# Patient Record
Sex: Male | Born: 1955 | Race: Black or African American | Hispanic: No | Marital: Married | Smoking: Never smoker
Health system: Southern US, Community
[De-identification: ages and names within clinical notes are randomized; demographics above are authoritative.]

## PROBLEM LIST (undated history)

## (undated) DIAGNOSIS — H539 Unspecified visual disturbance: Secondary | ICD-10-CM

## (undated) HISTORY — PX: CYST REMOVAL HAND: SHX6279

## (undated) HISTORY — DX: Unspecified visual disturbance: H53.9

---

## 1999-04-15 ENCOUNTER — Emergency Department (HOSPITAL_COMMUNITY): Admission: EM | Admit: 1999-04-15 | Discharge: 1999-04-15 | Payer: Self-pay | Admitting: Emergency Medicine

## 2005-02-20 ENCOUNTER — Emergency Department (HOSPITAL_COMMUNITY): Admission: EM | Admit: 2005-02-20 | Discharge: 2005-02-20 | Payer: Self-pay | Admitting: Emergency Medicine

## 2007-10-19 ENCOUNTER — Encounter: Admission: RE | Admit: 2007-10-19 | Discharge: 2007-10-19 | Payer: Self-pay | Admitting: Family Medicine

## 2008-12-10 IMAGING — US US ABDOMEN COMPLETE
1 series · 14 of 25 positions shown · non-contrast
Comparison: none

CLINICAL DATA: Abdominal and back pain. 
 ABDOMEN ULTRASOUND:
TECHNIQUE: Complete abdominal ultrasound examination was performed including evaluation of the liver, gallbladder, bile ducts, pancreas, kidneys, spleen, IVC, and abdominal aorta.  
 No comparison.

[Series 1: us abdomen complete · 0.35mm/px · 14 of 64 slices shown]
[im 1/64]
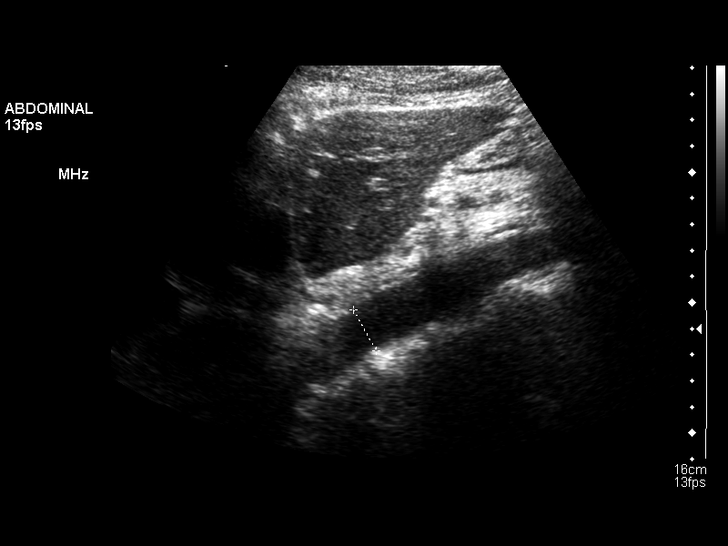
[im 6/64]
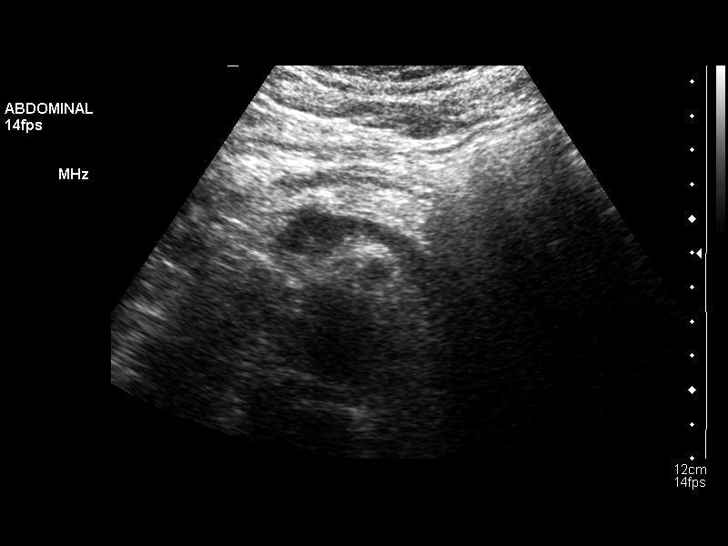
[im 11/64]
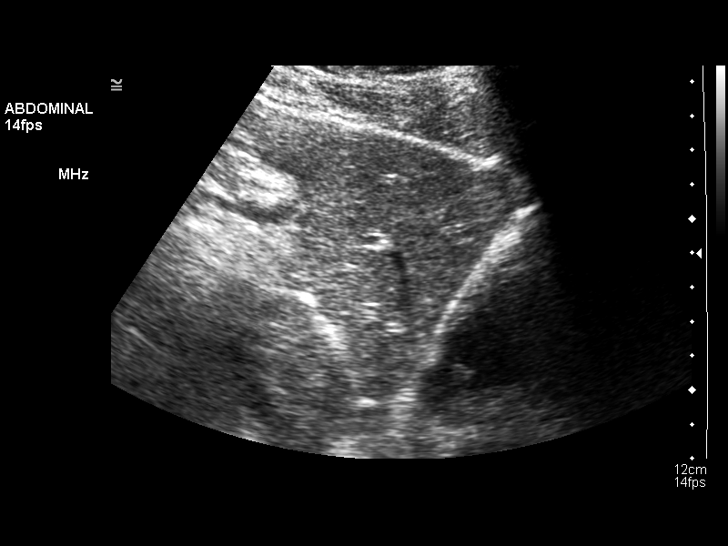
[im 16/64]
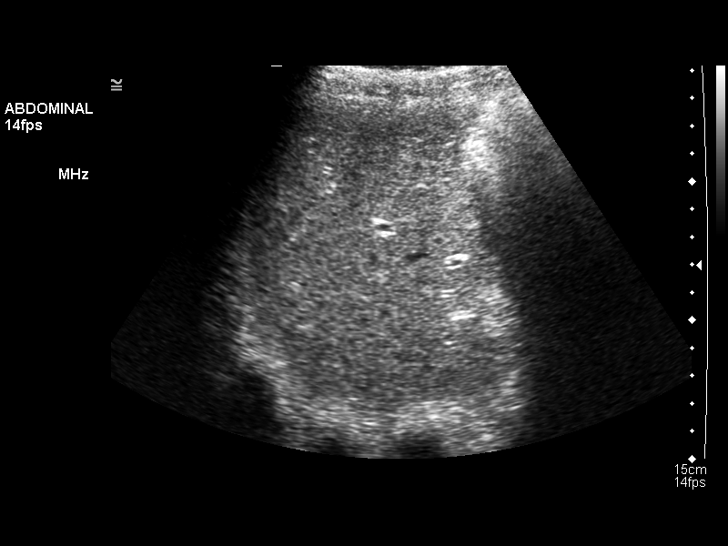
[im 22/64]
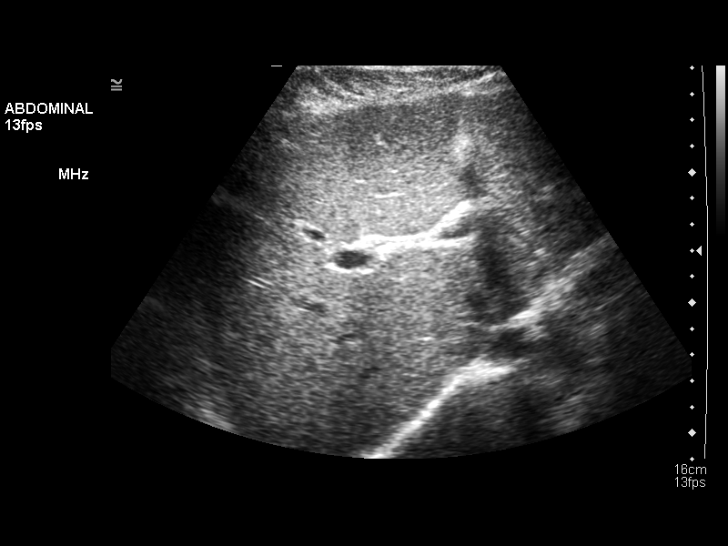
[im 24/64]
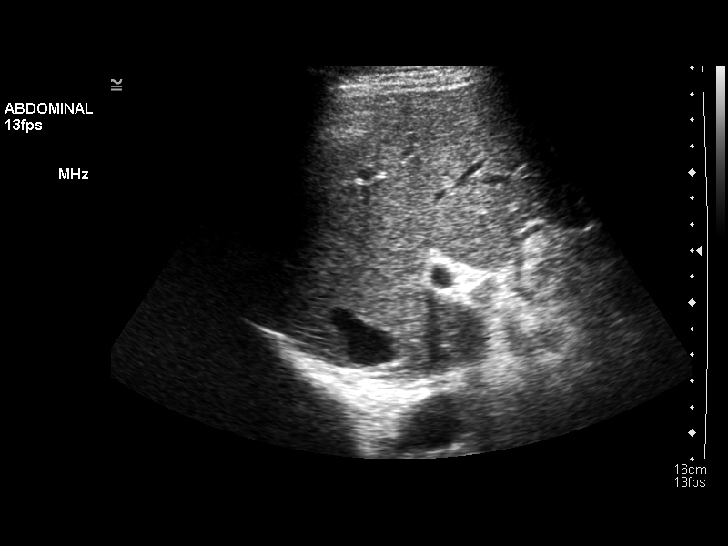
[im 29/64]
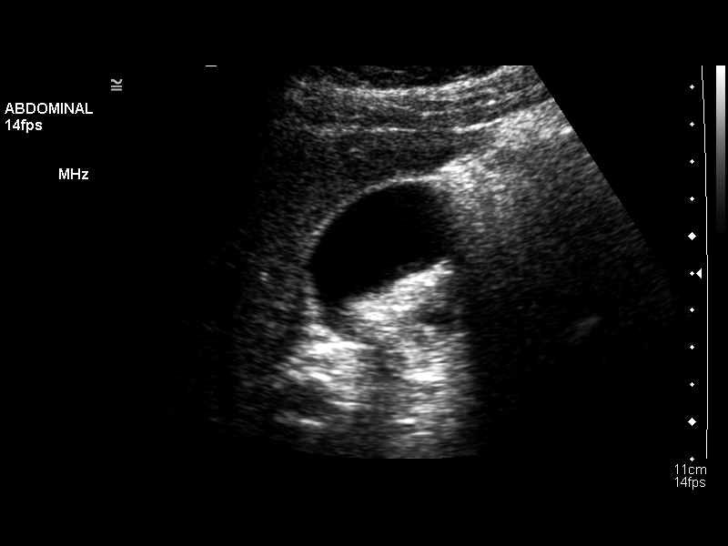
[im 35/64]
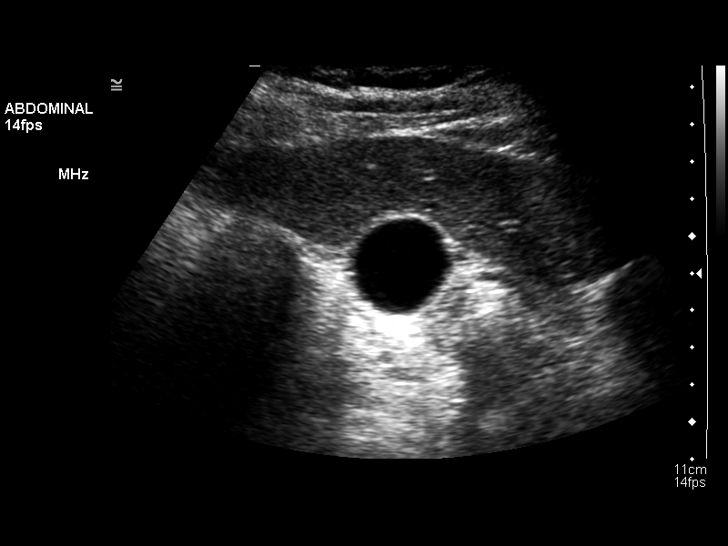
[im 40/64]
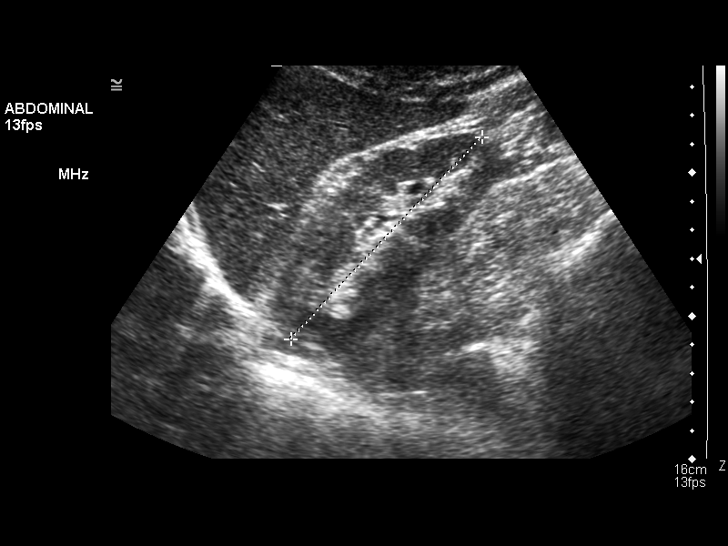
[im 43/64]
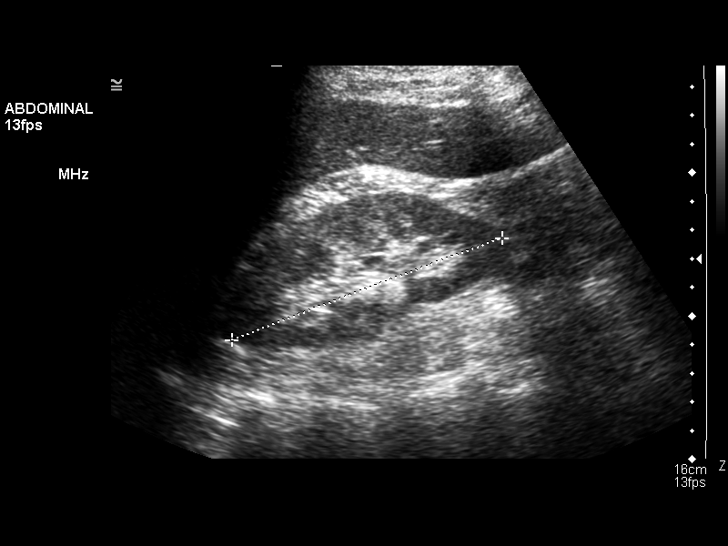
[im 48/64]
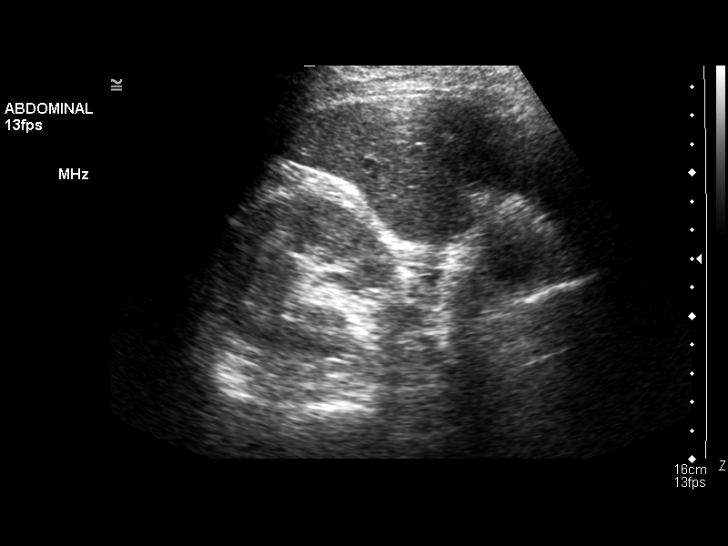
[im 53/64]
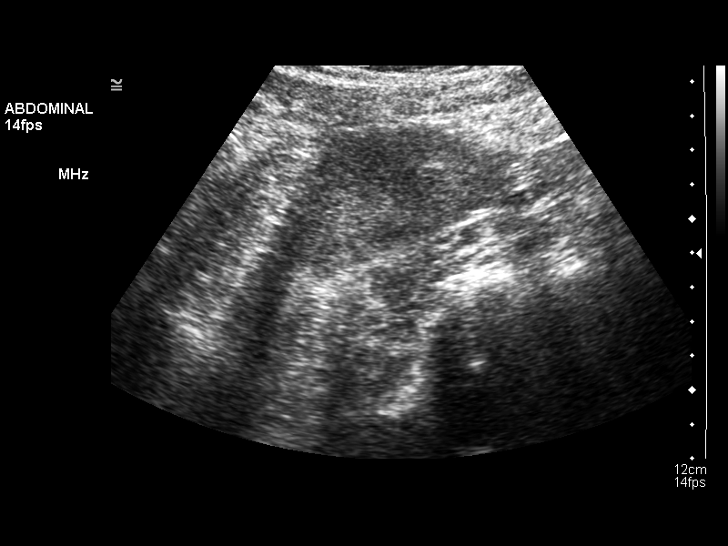
[im 58/64]
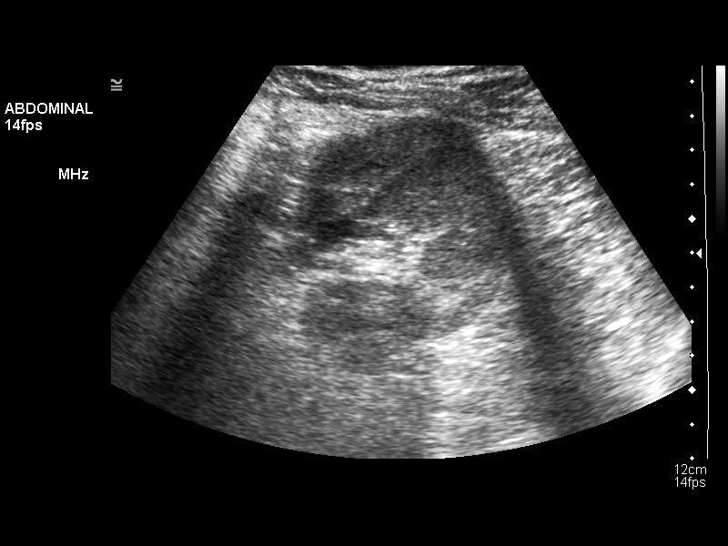
[im 64/64]
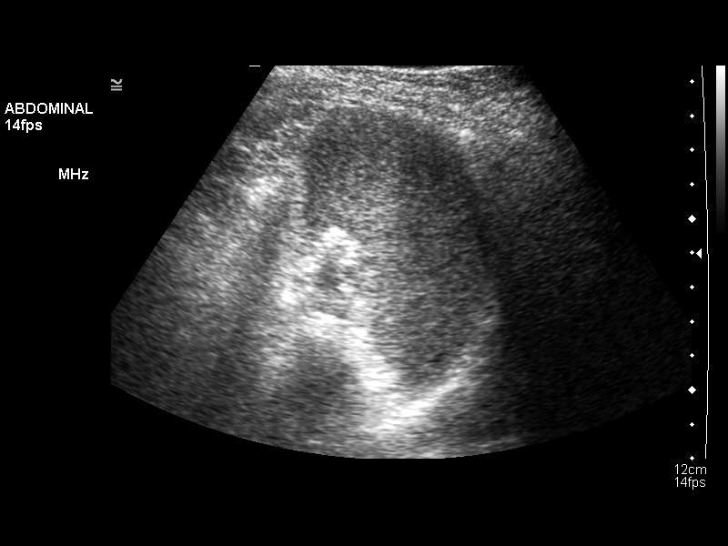

[14 of 25 positions shown; findings below may reference images not displayed]

FINDINGS: There is no evidence of gallstones or biliary ductal dilatation.  The liver is within normal limits in echogenicity, and no focal liver lesions are seen.  The visualized portions of the IVC and pancreas are unremarkable.  Tail of pancreas is obscured by overlying gastrointestinal gas.  
 There is no evidence of splenomegaly.  The kidneys are unremarkable, and there is no evidence of hydronephrosis.  The abdominal aorta is non-dilated.
 Gallbladder wall thickness 3 mm, common bile duct diameter 4 mm, spleen 8.9 cm, right kidney 10.4 cm, left kidney 10.5 cm, maximum abdominal aortic diameter 1.8 cm.
IMPRESSION: 1.  Tail of pancreas obscured. 
 2.  Otherwise negative.

## 2010-04-14 ENCOUNTER — Ambulatory Visit (HOSPITAL_BASED_OUTPATIENT_CLINIC_OR_DEPARTMENT_OTHER): Admission: RE | Admit: 2010-04-14 | Discharge: 2010-04-14 | Payer: Self-pay | Admitting: Orthopedic Surgery

## 2010-08-22 ENCOUNTER — Encounter: Payer: Self-pay | Admitting: Thoracic Surgery

## 2010-10-15 LAB — POCT HEMOGLOBIN-HEMACUE: Hemoglobin: 15.7 g/dL (ref 13.0–17.0)

## 2015-09-20 ENCOUNTER — Emergency Department (HOSPITAL_COMMUNITY)
Admission: EM | Admit: 2015-09-20 | Discharge: 2015-09-20 | Disposition: A | Discharge status: Home / Self Care | Payer: Self-pay | Attending: Emergency Medicine | Admitting: Emergency Medicine

## 2015-09-20 DIAGNOSIS — G510 Bell's palsy: Secondary | ICD-10-CM | POA: Insufficient documentation

## 2015-09-20 LAB — CBG MONITORING, ED: Glucose-Capillary: 100 mg/dL — ABNORMAL HIGH (ref 65–99)

## 2015-09-20 MED ORDER — PREDNISONE 50 MG PO TABS: 50.0000 mg | ORAL_TABLET | Freq: Every day | ORAL | Status: DC

## 2015-09-20 MED ORDER — PREDNISONE 20 MG PO TABS
60.0000 mg | ORAL_TABLET | Freq: Once | ORAL | Status: AC
  Administered 2015-09-20: 60 mg via ORAL
  Filled 2015-09-20: qty 3

## 2015-09-20 NOTE — ED Provider Notes (Signed)
CSN: [id number]     Arrival date & time 09/20/15  1924 History   First MD Initiated Contact with Patient 09/20/15 1951     Chief Complaint  Patient presents with  . Numbness     (Consider location/radiation/quality/duration/timing/severity/associated sxs/prior Treatment) HPI   60 year old male with left facial weakness. First noticed symptoms at approximately 5 PM today.  The left side of his face felt different.  Noticed that he was drooling slightly on the left side of his mouth. Symptoms have been stable since onset. Is been having some mild painin the left occipital region for the past couple days. No other acute complaints otherwise. Denies any visual changes. No numbness, tingling or focal loss of strength side from his face.Denies hyperacusis or change in taste. No history similar symptoms. Does not wear contacts.  No past medical history on file. No past surgical history on file. No family history on file. Social History  Substance Use Topics  . Smoking status: Not on file  . Smokeless tobacco: Not on file  . Alcohol Use: Not on file    Review of Systems  All systems reviewed and negative, other than as noted in HPI.   Allergies  Review of patient's allergies indicates not on file.  Home Medications   Prior to Admission medications   Medication Sig Start Date End Date Taking? Authorizing Provider  predniSONE (DELTASONE) 50 MG tablet Take 1 tablet (50 mg total) by mouth daily. 09/20/15   Raeford Razor, MD   BP 183/118 mmHg  Pulse 75  Temp(Src) 98.3 F (36.8 C) (Oral)  Resp 16  SpO2 100% Physical Exam  Constitutional: He appears well-developed and well-nourished. No distress.  HENT:  Head: Normocephalic and atraumatic.  Eyes: Conjunctivae are normal. Right eye exhibits no discharge. Left eye exhibits no discharge.  Neck: Neck supple.  Cardiovascular: Normal rate, regular rhythm and normal heart sounds.  Exam reveals no gallop and no friction rub.   No murmur  heard. Pulmonary/Chest: Effort normal and breath sounds normal. No respiratory distress.  Abdominal: Soft. He exhibits no distension. There is no tenderness.  Musculoskeletal: He exhibits no edema or tenderness.  Neurological: He is alert.  Left facial weakness. Forehead is definitely not spared. Cranial nerves otherwise intact. Strength is 5 out of 5 bilateral upper and lower extremities.  Sensation is intact to light touch. Good finger to nose testing bilaterally. His gait is steady.  Skin: Skin is warm and dry.  Psychiatric: He has a normal mood and affect. His behavior is normal. Thought content normal.  Nursing note and vitals reviewed.   ED Course  Procedures (including critical care time) Labs Review Labs Reviewed  CBG MONITORING, ED - Abnormal; Notable for the following:    Glucose-Capillary 100 (*)    All other components within normal limits    Imaging Review No results found. I have personally reviewed and evaluated these images and lab results as part of my medical decision-making.   EKG Interpretation None      MDM   Final diagnoses:  Left-sided Bell's palsy    59yM with L facial weakness. Forehead involved. Consistent with facial nerve palsy. Course steroids. PRN eye lubrication. Return precautions discussed.     Raeford Razor, MD 09/25/15 2231

## 2015-09-20 NOTE — Discharge Instructions (Signed)
Bell Palsy Bell palsy is a condition in which the muscles on one side of the face become paralyzed. This often causes one side of the face to droop. It is a common condition and most people recover completely. RISK FACTORS Risk factors for Bell palsy include:  Pregnancy.  Diabetes.  An infection by a virus, such as infections that cause cold sores. CAUSES  Bell palsy is caused by damage to or inflammation of a nerve in your face. It is unclear why this happens, but an infection by a virus may lead to it. Most of the time the reason it happens is unknown. SIGNS AND SYMPTOMS  Symptoms can range from mild to severe and can take place over a number of hours. Symptoms may include:  Being unable to:  Raise one or both eyebrows.  Close one or both eyes.  Feel parts of your face (facial numbness).  Drooping of the eyelid and corner of the mouth.  Weakness in the face.  Paralysis of half your face.  Loss of taste.  Sensitivity to loud noises.  Difficulty chewing.  Tearing up of the affected eye.  Dryness in the affected eye.  Drooling.  Pain behind one ear. DIAGNOSIS  Diagnosis of Bell palsy may include:  A medical history and physical exam.  Electromyography (EMG). This is a test that checks how your nerves are working. TREATMENT  Treatment may include antiviral medicine to help shorten the length of the condition. Sometimes treatment is not needed and the symptoms go away on their own. HOME CARE INSTRUCTIONS   Take medicines only as directed by your health care provider.  Do facial massages and exercises as directed by your health care provider.  If your eye is affected:  Use moisturizing eye drops to prevent drying of your eye as directed by your health care provider.  Protect your eye as directed by your health care provider. SEEK MEDICAL CARE IF:  Your symptoms do not get better or get worse.  You are drooling.  Your eye is red, irritated, or  hurts. SEEK IMMEDIATE MEDICAL CARE IF:   Another part of your body feels weak or numb.  You have difficulty swallowing.  You have a fever along with symptoms of Bell palsy.  You develop neck pain. MAKE SURE YOU:   Understand these instructions.  Will watch your condition.  Will get help right away if you are not doing well or get worse.   This information is not intended to replace advice given to you by your health care provider. Make sure you discuss any questions you have with your health care provider.   Document Released: 07/19/2005 Document Revised: 04/09/2015 Document Reviewed: 10/26/2013 Elsevier Interactive Patient Education [employer]

## 2015-09-20 NOTE — ED Notes (Signed)
At 5 pm today facial had facial droop on left side and wasn't able to spit or swallow,  Left eye droop and pain up back of left side of neck  ,  He feels it is starting to resolve,  Blood pressure elevated in triage

## 2015-12-30 ENCOUNTER — Ambulatory Visit (INDEPENDENT_AMBULATORY_CARE_PROVIDER_SITE_OTHER): Payer: BLUE CROSS/BLUE SHIELD | Admitting: Neurology

## 2015-12-30 ENCOUNTER — Encounter: Payer: Self-pay | Admitting: Neurology

## 2015-12-30 VITALS — BP 138/86 | HR 62 | Resp 14 | Ht 67.5 in | Wt 176.0 lb

## 2015-12-30 DIAGNOSIS — G510 Bell's palsy: Secondary | ICD-10-CM | POA: Diagnosis not present

## 2015-12-30 NOTE — Patient Instructions (Signed)
Continue to use moisturizing eyedrops in the left eye every couple of hours.  At night, consider using Lacri-Lube that is a gel that you can put in right before he goes to bed that will help keep the eye moist at night.  We will schedule you for an MRI of the head

## 2015-12-30 NOTE — Progress Notes (Signed)
[facility]  PATIENT: Jimmy Ford DOB: 11-11-1955  REFERRING DOCTOR OR PCP:  Renaye Rakers SOURCE: patient, records from Dr. Parke Simmers  _________________________________   HISTORICAL  CHIEF COMPLAINT:  Chief Complaint  Patient presents with  . Facial Paralysis    Jimmy Ford is here with his wife Gardiner Ramus for eval of left sided facial paralysis onset 2 mos. ago.  Unable to close left eye.  Denies illness prior to paralysis.  He saw pcp for same and has been on Valcyclovir  bid for 2 weeks./fim    HISTORY OF PRESENT ILLNESS:  I had the pleasure seeing you patient, Jimmy Ford, [facility] for neurologic consultation regarding his left-sided Bell's palsy. As you know, he is a 60 year old man who had the onset of left facial weakness one afternoon in mid to late February. He also had an all ache behind the left ear around the time of the onset. His wife notes that his face seemed fine and he went into a room for an hour and when he came out the weakness was noted. Later that evening, he went to the emergency room and was given steroids. He did not note any improvement over the next couple of months. He saw his primary care physician, Dr. Parke Simmers, 2 weeks ago and was placed on Valtrex and referred to me.  Weakness involves the muscles of the entire left face including the forehead. He has difficulty closing the eye but feels this may be slightly better than at the onset of the symptoms. He has not noted any other possible improvement in strength. He uses eyedrops during the day.     He has never had Bell's palsy in the past. He denies any trauma or infection around the time the symptoms started. He denies cold sores or shingles. No rashes.  He has noted that the left ear is a little more sensitive to loud noises than the right ear and his wife notes that he constantly ask her to turn the TV down.   He has not noted any change in how things taste.  He  is otherwise healthy. He denies numbness or weakness elsewhere. He notes no change with walking or balance.  No diplopia. No headache.   REVIEW OF SYSTEMS: Constitutional: No fevers, chills, sweats, or change in appetite Eyes: No visual changes, double vision, eye pain Ear, nose and throat: No hearing loss, ear pain, nasal congestion, sore throat Cardiovascular: No chest pain, palpitations Respiratory: No shortness of breath at rest or with exertion.   No wheezes GastrointestinaI: No nausea, vomiting, diarrhea, abdominal pain, fecal incontinence Genitourinary: No dysuria, urinary retention or frequency.  No nocturia. Musculoskeletal: No neck pain, back pain Integumentary: No rash, pruritus, skin lesions Neurological: as above Psychiatric: No depression at this time.  No anxiety Endocrine: No palpitations, diaphoresis, change in appetite, change in weigh or increased thirst Hematologic/Lymphatic: No anemia, purpura, petechiae. Allergic/Immunologic: No itchy/runny eyes, nasal congestion, recent allergic reactions, rashes  ALLERGIES: No Known Allergies  HOME MEDICATIONS:  Current outpatient prescriptions:  .  valACYclovir (VALTREX) 500 MG tablet, Take 500 mg by mouth 2 (two) times daily., Disp: , Rfl: 0  PAST MEDICAL HISTORY: Past Medical History  Diagnosis Date  . Vision abnormalities     PAST SURGICAL HISTORY: Past Surgical History  Procedure Laterality Date  . Cyst removal hand      FAMILY HISTORY: Family History  Problem Relation Age of Onset  . Glaucoma Mother   . Diabetes Father  SOCIAL HISTORY:  Social History   Social History  . Marital Status: Married    Spouse Name: N/A  . Number of Children: N/A  . Years of Education: N/A   Occupational History  . Not on file.   Social History Main Topics  . Smoking status: Never Smoker   . Smokeless tobacco: Not on file  . Alcohol Use: No  . Drug Use: No  . Sexual Activity: Not on file   Other Topics  Concern  . Not on file   Social History Narrative  . No narrative on file     PHYSICAL EXAM  Filed Vitals:   12/30/15 1023  BP: 138/86  Pulse: 62  Resp: 14  Height: 5' 7.5" (1.715 m)  Weight: 176 lb (79.833 kg)    Body mass index is 27.14 kg/(m^2).   General: The patient is well-developed and well-nourished and in no acute distress  Eyes:  Funduscopic exam shows normal optic discs and retinal vessels.  Skin:   No rashes.    Neurologic Exam  Mental status: The patient is alert and oriented x 3 at the time of the examination. The patient has apparent normal recent and remote memory, with an apparently normal attention span and concentration ability.   Speech is normal.  Cranial nerves: Extraocular movements are full. Pupils are equal, round, and reactive to light and accomodation.  Visual fields are full.   There is good facial/head sensation to soft touch bilaterally except posterior tongue on the left. .Facial strength is reduced on the left c/w Bell's palsy.  Trapezius and sternocleidomastoid strength is normal. No dysarthria is noted.  The tongue is midline, and the patient has symmetric elevation of the soft palate. No obvious hearing deficits are noted.  Motor:  Muscle bulk is normal.   Tone is normal. Strength is  5 / 5 in all 4 extremities.   Sensory: Sensory testing is intact to soft touch and vibration sensation in all 4 extremities.  Coordination: Cerebellar testing reveals good finger-nose-finger and heel-to-shin bilaterally.  Gait and station: Station is normal.   Gait is normal. Tandem gait is normal. Romberg is negative.   Reflexes: Deep tendon reflexes are symmetric and normal bilaterally.       DIAGNOSTIC DATA (LABS, IMAGING, TESTING) - I reviewed patient records, labs, notes, testing and imaging myself where available.  Lab Results  Component Value Date   HGB 15.7 04/14/2010   No results found for: NA, K, CL, CO2, GLUCOSE, BUN, CREATININE, CALCIUM,  PROT, ALBUMIN, AST, ALT, ALKPHOS, BILITOT, GFRNONAA, GFRAA     ASSESSMENT AND PLAN   Left-sided Bell's palsy - Plan: MR Brain W Wo Contrast   In summary, Mr. Jimmy Ford is a 60 year old man who had the onset of left Bell's palsy 3-1/2 months ago.   Currently, he is practically the same as he was at diagnosis though he may be able to close the left eye slightly better. We had a discussion about Bell's palsy and natural history of improvement. In general, if there is not significant improvement by 2 weeks, recovery would typically take 4-6 months. Therefore, I am hopeful that he will begin to see some more definite improvement over the next month and get closer to baseline by the end of August. I also discussed with him the high probability of some degree of crossed reinnervation (synkinesis) where nerve fibers that normally go to the mouth could go to the facial muscles around the eye and vice versa. Additionally nerve fibers  that go to the salivary glands sometimes and up innervating the lacrimal gland.    He still has some dry eyes and I discussed eye care with him.  I would like him to increase the use of eyedrops one the day to every 2 hours and to use Lacri-Lube at night.  He will return to see me in 8 weeks or sooner if there are new or worsening symptoms.  Thank you for asking me to see Mr. Mikelson for neurologic consultation. Please let me know if I can be of further assistance with her or other patients in the future. Richard A. Epimenio Foot, MD, PhD 12/30/2015, 10:34 AM Certified in Neurology, Clinical Neurophysiology, Sleep Medicine, Pain Medicine and Neuroimaging  [facility] [address], [address] [place], [place] [address] [phone]

## 2015-12-31 ENCOUNTER — Telehealth: Payer: Self-pay | Admitting: Neurology

## 2015-12-31 NOTE — Telephone Encounter (Signed)
I have spoken with Justin MendFredrick.  He sts. someone called him about his mri.  Message printed and given to Danielle/fim

## 2015-12-31 NOTE — Telephone Encounter (Signed)
Message For: OFFICE               Taken 31-MAY-17 at  2:35PM by DIS ------------------------------------------------------------  Caller  Jimmy MonksFREDRICK Ford            CID  [id number]   Patient  SAME                  Pt's Dr  Epimenio FootSATER         Area Code  336  Phone#  [phone] *  DOB  [birth year]     RE  RETURNING A MISSED CALL FROM THE OFFICE                                                                 Disp:Y/N  N  If Y = C/B If No Response In 20minutes

## 2016-01-01 NOTE — Telephone Encounter (Signed)
Returned patients call and left a VM asking him to call back.

## 2016-01-07 ENCOUNTER — Ambulatory Visit (INDEPENDENT_AMBULATORY_CARE_PROVIDER_SITE_OTHER): Payer: BLUE CROSS/BLUE SHIELD

## 2016-01-07 DIAGNOSIS — G510 Bell's palsy: Secondary | ICD-10-CM | POA: Diagnosis not present

## 2016-01-09 ENCOUNTER — Telehealth: Payer: Self-pay | Admitting: Neurology

## 2016-01-09 DIAGNOSIS — R29810 Facial weakness: Secondary | ICD-10-CM

## 2016-01-09 DIAGNOSIS — I639 Cerebral infarction, unspecified: Secondary | ICD-10-CM

## 2016-01-09 MED ORDER — GADOPENTETATE DIMEGLUMINE 469.01 MG/ML IV SOLN: 15.0000 mL | Freq: Once | INTRAVENOUS | Status: AC | PRN

## 2016-01-09 NOTE — Telephone Encounter (Signed)
I tried to call [name] on Friday, 01/09/2016. I could not get through.  I wanted to let him know that the MRI of the brain shows a small old stroke that is not causing his facial weakness.   It probably did not cause any symptoms when it happened.  I would like to do the following 1.   He should start aspirin 81 mg daily 2.    check a carotid ultrasound study 3.   Echocardiogram

## 2016-01-12 NOTE — Telephone Encounter (Signed)
LMTC./fim 

## 2016-01-16 NOTE — Addendum Note (Signed)
Addended by: Candis SchatzMISENHEIMER, Laresa Oshiro I on: 01/16/2016 08:29 AM   Modules accepted: Orders

## 2016-01-16 NOTE — Telephone Encounter (Signed)
I have spoken with Jimmy Ford this morning, and per RAS, advised that MRI brain showed a small, old stroke, that is not causing his facial weakness, and b/c of size, location, probably did not cause sx. when it happened.  RAS would like to determine the cause of this stroke, so he would like to obtain an echo and carotid u/s, to look for blockages in the carotid arteries, or heart defect that may have caused stroke.  He should take a baby asa daily.  Jimmy Ford verbalized understanding of same, is agreeable with this tx. plan.  Orders placed in EPIC./fim

## 2016-01-20 ENCOUNTER — Telehealth: Payer: Self-pay | Admitting: Neurology

## 2016-01-20 NOTE — Telephone Encounter (Signed)
Would you please call to schedule this patient? Thanks

## 2016-01-20 NOTE — Telephone Encounter (Signed)
Pt called to make appt for US. Please call and advise.

## 2016-01-21 NOTE — Telephone Encounter (Signed)
Patient is calling about the scheduling of his US appt.  He was advised you will be calling to schedule.  Thanks!

## 2016-01-21 NOTE — Telephone Encounter (Signed)
Patient called back, request that you call cell phone number [phone].

## 2016-01-29 ENCOUNTER — Ambulatory Visit (INDEPENDENT_AMBULATORY_CARE_PROVIDER_SITE_OTHER): Payer: BLUE CROSS/BLUE SHIELD

## 2016-01-29 DIAGNOSIS — R29810 Facial weakness: Secondary | ICD-10-CM | POA: Diagnosis not present

## 2016-01-29 DIAGNOSIS — I639 Cerebral infarction, unspecified: Secondary | ICD-10-CM | POA: Diagnosis not present

## 2016-02-09 ENCOUNTER — Telehealth: Payer: Self-pay | Admitting: *Deleted

## 2016-02-09 NOTE — Telephone Encounter (Signed)
-----   Message from Asa Lenteichard A Sater, MD sent at 02/09/2016  8:40 AM EDT ----- Please let him know that the carotid ultrasound is normal.

## 2016-02-09 NOTE — Telephone Encounter (Signed)
LMTC./fim 

## 2016-02-09 NOTE — Telephone Encounter (Signed)
-----   Message from Richard A Sater, MD sent at 02/09/2016  8:40 AM EDT ----- Please let him know that the carotid ultrasound is normal. 

## 2016-02-09 NOTE — Telephone Encounter (Signed)
I have spoken with mr. Driskill and per RAS, advised that carotid u/s is normal.  He verbalized understanding of same/fim

## 2016-02-17 ENCOUNTER — Telehealth: Payer: Self-pay | Admitting: Neurology

## 2016-02-17 NOTE — Telephone Encounter (Signed)
Jimmy Ford [phone] called sts echo needs PA. Please touch base with her. His appt his Monday 7/24.  Thanks!

## 2016-02-19 NOTE — Telephone Encounter (Signed)
Charmaine/[facility] called regarding PA for echo scheduled Monday July 24th. Please call [phone], Suan HalterCharmaine will be out of the office on Monday July 24th.

## 2016-02-19 NOTE — Telephone Encounter (Signed)
Hey Faith FYI . Echo was approved approval # [id number] dates 02-19-2016 - 09/17-2017. Patient is scheduled for Monday 02-23-2016. Patient is aware of details.

## 2016-02-19 NOTE — Telephone Encounter (Signed)
Noted/fim 

## 2016-02-23 ENCOUNTER — Ambulatory Visit (HOSPITAL_COMMUNITY): Payer: BLUE CROSS/BLUE SHIELD | Attending: Internal Medicine

## 2016-02-23 ENCOUNTER — Other Ambulatory Visit (HOSPITAL_COMMUNITY): Payer: Self-pay

## 2016-02-23 DIAGNOSIS — I639 Cerebral infarction, unspecified: Secondary | ICD-10-CM | POA: Diagnosis not present

## 2016-02-23 DIAGNOSIS — I253 Aneurysm of heart: Secondary | ICD-10-CM | POA: Diagnosis not present

## 2016-02-23 DIAGNOSIS — R29810 Facial weakness: Secondary | ICD-10-CM

## 2016-02-23 DIAGNOSIS — I340 Nonrheumatic mitral (valve) insufficiency: Secondary | ICD-10-CM | POA: Insufficient documentation

## 2016-02-24 ENCOUNTER — Telehealth: Payer: Self-pay | Admitting: *Deleted

## 2016-02-24 DIAGNOSIS — R931 Abnormal findings on diagnostic imaging of heart and coronary circulation: Secondary | ICD-10-CM

## 2016-02-24 NOTE — Telephone Encounter (Signed)
LMTC./fim 

## 2016-02-24 NOTE — Telephone Encounter (Signed)
-----   Message from Asa Lente, MD sent at 02/23/2016  5:30 PM EDT ----- Please let him know that the echocardiogram showed that there was some flow from the right side of the heart to the left side of his heart that could be a risk factor for stroke. I would like him to take a baby aspirin daily and I would like him to see a cardiologist. The echocardiogram was read by [facility] at [facility].

## 2016-02-27 NOTE — Addendum Note (Signed)
Addended by: Candis Schatz I on: 02/27/2016 10:11 AM   Modules accepted: Orders

## 2016-02-27 NOTE — Telephone Encounter (Signed)
I have spoken with Mr. Jimmy Ford this morning and per RAS, advised that eco showed flow from right to left side of heart, that puts him at a higher risk for stroke.  He should take one baby asa daily, and referral will be made to cardiology for further eval.  He verbalized understanding of same, is agreeable with this tx. plan--has an appt. with RAS next week and will discuss further at that time. Cardiology referral in EPIC/fim

## 2016-02-27 NOTE — Telephone Encounter (Signed)
Pt returned call about getting results. Please call cell [phone]. He will be at work around 9:30. May leave detailed message on voice mail.

## 2016-03-01 ENCOUNTER — Ambulatory Visit (INDEPENDENT_AMBULATORY_CARE_PROVIDER_SITE_OTHER): Payer: BLUE CROSS/BLUE SHIELD | Admitting: Neurology

## 2016-03-01 ENCOUNTER — Encounter: Payer: Self-pay | Admitting: Neurology

## 2016-03-01 ENCOUNTER — Encounter: Payer: Self-pay | Admitting: *Deleted

## 2016-03-01 VITALS — BP 127/79 | HR 60 | Ht 67.5 in | Wt 177.0 lb

## 2016-03-01 DIAGNOSIS — Q2112 Patent foramen ovale: Secondary | ICD-10-CM

## 2016-03-01 DIAGNOSIS — G510 Bell's palsy: Secondary | ICD-10-CM

## 2016-03-01 DIAGNOSIS — Z8673 Personal history of transient ischemic attack (TIA), and cerebral infarction without residual deficits: Secondary | ICD-10-CM

## 2016-03-01 DIAGNOSIS — Q211 Atrial septal defect: Secondary | ICD-10-CM

## 2016-03-01 DIAGNOSIS — Z8669 Personal history of other diseases of the nervous system and sense organs: Secondary | ICD-10-CM

## 2016-03-01 NOTE — Progress Notes (Signed)
[facility]  PATIENT: Jimmy Ford DOB: [birth year]  REFERRING DOCTOR OR PCP:  Renaye Rakers SOURCE: patient, records from Dr. Parke Simmers  _________________________________   HISTORICAL  CHIEF COMPLAINT:  Chief Complaint  Patient presents with  . Bell's Palsy    He is here with his wife, Gardiner Ramus.  States he is still having some mild difficulty closing his left eye but has noticed an overall improvement since his diagnosis.  He woudl like to further review his echocardiogram.    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      Bell's Palsy:   He had the onset of left facial weakness one afternoon in late February. He also had an all ache behind the left ear around the time of the onset.   Later that evening, he went to the emergency room and was given steroids. He did not note any improvement over the next couple of months. I saw him about 4 months after the onset.   He still had a total VIIth nerve palsy at that time.    Over the past 3 weeks, he has noted some more muscle tone , lower face better than upper face.   He can close eye better and have a partial smile  CVA/PFO:   He has never reported actual symptoms from a stroke. However, there is a small wedge-shaped region of encephalomalacia in the left parieto-occipital region consistent with a small remote infarction, possibly embolic.     Echocardiogram showed an aneurysmal atrial septum and bubble contrast went from the right to the left consistent with a PFO.   I showed Mr. and Mrs. Warshawsky the MRI of the brain with the small parietal chronic wedge-shaped stroke and a mild enhancement noted in part of the left facial nerve.   We discussed the findings of the carotid ultrasound and more importantly the findings of the echocardiogram.  He is otherwise healthy. He denies numbness or weakness elsewhere. He  notes no change with walking or balance.  No diplopia. No headache.   REVIEW OF SYSTEMS: Constitutional: No fevers, chills, sweats, or change in appetite Eyes: No visual changes, double vision, eye pain Ear, nose and throat: No hearing loss, ear pain, nasal congestion, sore throat Cardiovascular: No chest pain, palpitations Respiratory: No shortness of breath at rest or with exertion.   No wheezes GastrointestinaI: No nausea, vomiting, diarrhea, abdominal pain, fecal incontinence Genitourinary: No dysuria, urinary retention or frequency.  No nocturia. Musculoskeletal: No neck pain, back pain Integumentary: No rash, pruritus, skin lesions Neurological: as above Psychiatric: No depression at this time.  No anxiety Endocrine: No palpitations, diaphoresis, change in appetite, change in weigh or increased thirst Hematologic/Lymphatic: No anemia, purpura, petechiae. Allergic/Immunologic: No itchy/runny eyes, nasal congestion, recent allergic reactions, rashes  ALLERGIES: No Known Allergies  HOME MEDICATIONS:  Current Outpatient Prescriptions:  .  aspirin 81 MG tablet, Take 81 mg by mouth daily., Disp: , Rfl:  No current facility-administered medications for this visit.   Facility-Administered Medications Ordered in Other Visits:  .  gadopentetate dimeglumine (MAGNEVIST) injection 15 mL, 15 mL, Intravenous, Once PRN, Asa Lente, MD  PAST MEDICAL HISTORY: Past Medical History:  Diagnosis Date  . Vision abnormalities     PAST SURGICAL HISTORY: Past Surgical History:  Procedure Laterality Date  . CYST REMOVAL HAND      FAMILY HISTORY: Family History  Problem Relation Age of Onset  . Glaucoma Mother   . Diabetes Father     SOCIAL HISTORY:  Social History   Social History  . Marital status: Married    Spouse name: N/A  . Number of children: N/A  . Years of education: N/A   Occupational History  . Not on file.   Social History Main Topics  . Smoking status:  Never Smoker  . Smokeless tobacco: Not on file  . Alcohol use No  . Drug use: No  . Sexual activity: Not on file   Other Topics Concern  . Not on file   Social History Narrative  . No narrative on file     PHYSICAL EXAM  Vitals:   03/01/16 1325  BP: 127/79  Pulse: 60  Weight: 177 lb (80.3 kg)  Height: 5' 7.5" (1.715 m)    Body mass index is 27.31 kg/m.   General: The patient is well-developed and well-nourished and in no acute distress  Skin:   No rashes.    Neurologic Exam  Mental status: The patient is alert and oriented x 3 at the time of the examination. The patient has apparent normal recent and remote memory, with an apparently normal attention span and concentration ability.   Speech is normal.  Cranial nerves: Extraocular movements are full. Pupils are equal, round, and reactive to light and accomodation.  Visual fields are full.   There is good facial sensation to soft touch bilaterally.Facial strength is reduced on the left c/w Bell's palsy - there is more strength in the lower face and at the last visit..  Trapezius and sternocleidomastoid strength is normal. No dysarthria is noted.  The tongue is midline, and the patient has symmetric elevation of the soft palate. No obvious hearing deficits are noted.  Motor:  Muscle bulk is normal.   Tone is normal. Strength is  5 / 5 in all 4 extremities.   Sensory: Sensory testing is intact to soft touch and vibration sensation in all 4 extremities.  Gait and station: Station is normal.   Gait is normal. Tandem gait is normal.    Reflexes: Deep tendon reflexes are symmetric and normal bilaterally.       DIAGNOSTIC DATA (LABS, IMAGING, TESTING) - I reviewed patient records, labs, notes, testing and imaging myself where available.  Lab Results  Component Value Date   HGB 15.7 04/14/2010   No results found for: NA, K, CL, CO2, GLUCOSE, BUN, CREATININE, CALCIUM, PROT, ALBUMIN, AST, ALT, ALKPHOS, BILITOT, GFRNONAA,  GFRAA     ASSESSMENT AND PLAN   Left-sided Bell's palsy  History of embolic stroke  PFO with atrial septal aneurysm   1.   Advised to continue to take the aspirin daily. He will see a cardiologist shortly for an opinion regarding possible PFO closure as a PFO is likely linked to the embolic stroke. We discussed that the stroke is chronic and has nothing to do with his left facial weakness. 2.    His Bell's palsy appears to be improving some.    Now able to close his eyes and does not need as much eye care. 3.    He will return to see me in 6 months or sooner if there are new or worsening symptoms.  Thank you for asking me to see Mr. Quintero for neurologic consultation. Please let me know if I can be of further assistance with her or other patients in the future. Dao Memmott A. Epimenio Foot, MD, PhD 03/01/2016,  1:59 PM Certified in Neurology, Clinical Neurophysiology, Sleep Medicine, Pain Medicine and Neuroimaging  [facility] [address], [address] [place], [place] [address] [phone]

## 2016-03-17 ENCOUNTER — Encounter: Payer: Self-pay | Admitting: Cardiovascular Disease

## 2016-04-02 ENCOUNTER — Encounter: Payer: Self-pay | Admitting: Cardiology

## 2016-04-06 ENCOUNTER — Ambulatory Visit (INDEPENDENT_AMBULATORY_CARE_PROVIDER_SITE_OTHER): Payer: BLUE CROSS/BLUE SHIELD | Admitting: Cardiology

## 2016-04-06 ENCOUNTER — Encounter (INDEPENDENT_AMBULATORY_CARE_PROVIDER_SITE_OTHER): Payer: Self-pay

## 2016-04-06 ENCOUNTER — Encounter: Payer: Self-pay | Admitting: Cardiology

## 2016-04-06 VITALS — BP 120/96 | HR 59 | Ht 67.0 in | Wt 179.2 lb

## 2016-04-06 DIAGNOSIS — R931 Abnormal findings on diagnostic imaging of heart and coronary circulation: Secondary | ICD-10-CM | POA: Diagnosis not present

## 2016-04-06 NOTE — Progress Notes (Signed)
Cardiology Office Note   Date:  04/06/2016   ID:  Jimmy Ford, DOB [birth year], MRN [id number]  PCP:  Geraldo Pitter, MD  Cardiologist:  Regan Lemming, MD    Chief Complaint  Patient presents with  . New Patient (Initial Visit)    abnormal ECHO     History of Present Illness: Jimmy Ford is a 60 y.o. male who presents today for cardiology evaluation.   Referred from neurology for chronic embolic stroke.  Was found to have a PFO on his TTE. He is currently being evaluated by neurology for a Bell's palsy. He says that he does not have any chest pain, shortness of breath, PND, orthopnea. He does not have any symptoms from his previous stroke, and is unaware of when it could have happened. He does not have any palpitations weakness or fatigue that would lead me to think that he has atrial fibrillation. His blood pressure is well controlled today.   Today, he denies symptoms of palpitations, chest pain, shortness of breath, orthopnea, PND, lower extremity edema, claudication, dizziness, presyncope, syncope, bleeding, or neurologic sequela. The patient is tolerating medications without difficulties and is otherwise without complaint today.    Past Medical History:  Diagnosis Date  . Vision abnormalities    Past Surgical History:  Procedure Laterality Date  . CYST REMOVAL HAND       Current Outpatient Prescriptions  Medication Sig Dispense Refill  . aspirin 81 MG tablet Take 81 mg by mouth daily.     No current facility-administered medications for this visit.    Facility-Administered Medications Ordered in Other Visits  Medication Dose Route Frequency Provider Last Rate Last Dose  . gadopentetate dimeglumine (MAGNEVIST) injection 15 mL  15 mL Intravenous Once PRN Asa Lente, MD        Allergies:   Review of patient's allergies indicates no known allergies.   Social History:  The patient  reports that he has never smoked. He has never used smokeless  tobacco. He reports that he does not drink alcohol or use drugs.   Family History:  The patient's family history includes Diabetes in his father, maternal grandmother, and sister; Glaucoma in his mother.    ROS:  Please see the history of present illness.   Otherwise, review of systems is positive for headaches.   All other systems are reviewed and negative.    PHYSICAL EXAM: VS:  BP (!) 120/96   Pulse (!) 59   Ht 5\' 7"  (1.702 m)   Wt 179 lb 3.2 oz (81.3 kg)   BMI 28.07 kg/m  , BMI Body mass index is 28.07 kg/m. GEN: Well nourished, well developed, in no acute distress  HEENT: normal  Neck: no JVD, carotid bruits, or masses Cardiac: RRR; no murmurs, rubs, or gallops,no edema  Respiratory:  clear to auscultation bilaterally, normal work of breathing GI: soft, nontender, nondistended, + BS MS: no deformity or atrophy  Skin: warm and dry Neuro:  Strength and sensation are intact Psych: euthymic mood, full affect  EKG:  EKG is ordered today. Personal review of the ekg ordered shows sinus rhythm, rate 59  Recent Labs: No results found for requested labs within last 8760 hours.    Lipid Panel  No results found for: CHOL, TRIG, HDL, CHOLHDL, VLDL, LDLCALC, LDLDIRECT   Wt Readings from Last 3 Encounters:  04/06/16 179 lb 3.2 oz (81.3 kg)  03/01/16 177 lb (80.3 kg)  12/30/15 176 lb (79.8 kg)  Other studies Reviewed: Additional studies/ records that were reviewed today include: TTE 02/23/16  Review of the above records today demonstrates:  - Left ventricle: The cavity size was normal. Wall thickness was   normal. Systolic function was normal. The estimated ejection   fraction was in the range of 60% to 65%. - Mitral valve: There was mild regurgitation. - Left atrium: The atrium was mildly dilated. - Atrial septum: Atrial septum is aneurysmal. There is a small   amount of color seen at septum with color doppler With injection   of agitated saline there was appearance of  contrast in the L   sided chambers consistent with R to L shunt and PFO   ASSESSMENT AND PLAN:  1.  CVA: CVA was incidentally found on MRI testing. Subsequent echo showed a PFO. He was started on aspirin which I told him that he should continue to take.  2. PFO: PFO found incidentally on testing for old stroke. Currently, he is feeling well without any major complaints. It does not appear that his PFO is causing him any symptoms at the moment. At the moment, there is no indication for closure of his PFO, as it is not been shown to change his stroke risk. He should continue his aspirin.     Current medicines are reviewed at length with the patient today.   The patient does not have concerns regarding his medicines.  The following changes were made today:  none  Labs/ tests ordered today include:  No orders of the defined types were placed in this encounter.    Disposition:   FU with Alvey Brockel, PRN  Signed, Shaneisha Burkel Jorja LoaMartin Jadin Creque, MD  04/06/2016 2:39 PM     [facility] [address] [place] [address] [phone] (office) [phone] (fax)

## 2016-04-06 NOTE — Patient Instructions (Signed)
Medication Instructions:  Your physician recommends that you continue on your current medications as directed. Please refer to the Current Medication list given to you today.   Labwork: None ordered    Testing/Procedures: None ordered   Follow-Up:  Follow up as needed with general cardiology.

## 2016-04-12 ENCOUNTER — Ambulatory Visit: Payer: BLUE CROSS/BLUE SHIELD | Admitting: Cardiovascular Disease

## 2016-09-06 ENCOUNTER — Ambulatory Visit: Payer: BLUE CROSS/BLUE SHIELD | Admitting: Neurology

## 2016-09-07 ENCOUNTER — Encounter: Payer: Self-pay | Admitting: Neurology

## 2016-10-07 ENCOUNTER — Encounter: Payer: Self-pay | Admitting: Neurology

## 2016-10-07 ENCOUNTER — Ambulatory Visit (INDEPENDENT_AMBULATORY_CARE_PROVIDER_SITE_OTHER): Payer: BLUE CROSS/BLUE SHIELD | Admitting: Neurology

## 2016-10-07 VITALS — BP 145/97 | HR 59 | Resp 14 | Ht 67.0 in | Wt 178.5 lb

## 2016-10-07 DIAGNOSIS — Q211 Atrial septal defect: Secondary | ICD-10-CM

## 2016-10-07 DIAGNOSIS — R258 Other abnormal involuntary movements: Secondary | ICD-10-CM | POA: Diagnosis not present

## 2016-10-07 DIAGNOSIS — G510 Bell's palsy: Secondary | ICD-10-CM

## 2016-10-07 DIAGNOSIS — Z8679 Personal history of other diseases of the circulatory system: Secondary | ICD-10-CM

## 2016-10-07 DIAGNOSIS — Q2112 Patent foramen ovale: Secondary | ICD-10-CM

## 2016-10-07 DIAGNOSIS — Z8673 Personal history of transient ischemic attack (TIA), and cerebral infarction without residual deficits: Secondary | ICD-10-CM

## 2016-10-07 MED ORDER — ARTIFICIAL TEARS OP OINT: TOPICAL_OINTMENT | Freq: Two times a day (BID) | OPHTHALMIC | 5 refills | Status: AC

## 2016-10-07 NOTE — Progress Notes (Signed)
[facility]  PATIENT: Jimmy Ford DOB: Apr 02, 1956  REFERRING DOCTOR OR PCP:  Renaye Rakers SOURCE: patient, records from Dr. Parke Simmers  _________________________________   HISTORICAL  CHIEF COMPLAINT:  Chief Complaint  Patient presents with  . Bell's Palsy    Sts. left sided facial paralysis continues to improve.  Is closing left eye better now.  Sts. cardiologist didn't feel PFO needed to be closed. He continues to take daily ASA/fim  . Hx. of Embolic CVA  . PFO    HISTORY OF PRESENT ILLNESS:  Jimmy Ford is a 61 year old man with Bell's palsy and a chronic embolic stroke on MRI who has a PFO on ECHO.      Bell's Palsy:   Since the last visit, he feels the strength is better in the right face and he no longer feels numb.   Chewing is a little better.   He had the onset of left facial weakness one afternoon in late February 2017. At the onset, there was pain behind the left ear.   Later that evening, he went to the emergency room and was given steroids. He did not note any improvement over the next couple of months. I saw him about 4 months after the onset.   He still had a total VIIth nerve palsy at that time.    Currently he has more muscle tone in the lower face than upper face.   He can close eye better and have a partial smile.  He can't puff out left cheek.   He still has left ptosis and eye sometimes feels itchy.   He notes tears while eating on the left and has noticeable synkinesia with blinking and smiling.    CVA/PFO:   MRI last year showed a chronic stroke.  He has never reported actual symptoms from a stroke. However, there is a small wedge-shaped region of encephalomalacia in the left parieto-occipital region consistent with a small remote infarction, possibly embolic.     Echocardiogram showed an aneurysmal atrial septum and bubble contrast went from the right to the left consistent with a PFO.  He saw cardiology and PFO closure was not  recommended.   MRI showed of the brain with the small parietal chronic wedge-shaped stroke and a mild enhancement noted in part of the left facial nerve.   We discussed the findings of the carotid ultrasound and more importantly the findings of the echocardiogram.  He is otherwise healthy. He denies numbness or weakness elsewhere. He notes no change with walking or balance.  No diplopia. No headache.   REVIEW OF SYSTEMS: Constitutional: No fevers, chills, sweats, or change in appetite Eyes: No visual changes, double vision, eye pain Ear, nose and throat: No hearing loss, ear pain, nasal congestion, sore throat Cardiovascular: No chest pain, palpitations Respiratory: No shortness of breath at rest or with exertion.   No wheezes GastrointestinaI: No nausea, vomiting, diarrhea, abdominal pain, fecal incontinence Genitourinary: No dysuria, urinary retention or frequency.  No nocturia. Musculoskeletal: No neck pain, back pain Integumentary: No rash, pruritus, skin lesions Neurological: as above Psychiatric: No depression at this time.  No anxiety Endocrine: No palpitations, diaphoresis, change in appetite, change in weigh or increased thirst Hematologic/Lymphatic: No anemia, purpura, petechiae. Allergic/Immunologic: No itchy/runny eyes, nasal congestion, recent allergic reactions, rashes  ALLERGIES: No Known Allergies  HOME MEDICATIONS:  Current Outpatient Prescriptions:  .  aspirin 81 MG tablet, Take 81 mg by mouth daily., Disp: , Rfl:  .  artificial tears (LACRILUBE)  OINT ophthalmic ointment, Place into the left eye 2 (two) times daily., Disp: 2 Tube, Rfl: 5 No current facility-administered medications for this visit.   Facility-Administered Medications Ordered in Other Visits:  .  gadopentetate dimeglumine (MAGNEVIST) injection 15 mL, 15 mL, Intravenous, Once PRN, Asa Lenteichard A Ismelda Weatherman, MD  PAST MEDICAL HISTORY: Past Medical History:  Diagnosis Date  . Vision abnormalities      PAST SURGICAL HISTORY: Past Surgical History:  Procedure Laterality Date  . CYST REMOVAL HAND      FAMILY HISTORY: Family History  Problem Relation Age of Onset  . Glaucoma Mother   . Diabetes Father   . Diabetes Sister   . Diabetes Maternal Grandmother     SOCIAL HISTORY:  Social History   Social History  . Marital status: Married    Spouse name: N/A  . Number of children: N/A  . Years of education: N/A   Occupational History  . Not on file.   Social History Main Topics  . Smoking status: Never Smoker  . Smokeless tobacco: Never Used  . Alcohol use No  . Drug use: No  . Sexual activity: Not on file   Other Topics Concern  . Not on file   Social History Narrative  . No narrative on file     PHYSICAL EXAM  Vitals:   10/07/16 0929  BP: (!) 145/97  Pulse: (!) 59  Resp: 14  Weight: 178 lb 8 oz (81 kg)  Height: 5\' 7"  (1.702 m)    Body mass index is 27.96 kg/m.   General: The patient is well-developed and well-nourished and in no acute distress  Skin:   No rashes.    Neurologic Exam  Mental status: The patient is alert and oriented x 3 at the time of the examination. The patient has apparent normal recent and remote memory, with an apparently normal attention span and concentration ability.   Speech is normal.  Cranial nerves: Extraocular movements are full. Pupils are equal, round, and reactive to light and accomodation.  Visual fields are full.   There is good facial sensation to soft touch bilaterally.Facial strength is reduced on the left c/w Bell's palsy - there is more strength in the lower face than upper face.   Considerable synkinesia .Marland Kitchen.  Trapezius and sternocleidomastoid strength is normal. No dysarthria is noted.  The tongue is midline, and the patient has symmetric elevation of the soft palate. No obvious hearing deficits are noted.  Motor:  Muscle bulk is normal.   Tone is normal. Strength is  5 / 5 in all 4 extremities.   Sensory:  Sensory testing is intact to soft touch and vibration sensation in all 4 extremities.  Gait and station: Station is normal.   Gait is normal. Tandem gait is normal.    Reflexes: Deep tendon reflexes are symmetric and normal bilaterally.       DIAGNOSTIC DATA (LABS, IMAGING, TESTING) - I reviewed patient records, labs, notes, testing and imaging myself where available.  Lab Results  Component Value Date   HGB 15.7 04/14/2010   No results found for: NA, K, CL, CO2, GLUCOSE, BUN, CREATININE, CALCIUM, PROT, ALBUMIN, AST, ALT, ALKPHOS, BILITOT, GFRNONAA, GFRAA     ASSESSMENT AND PLAN   Left-sided Bell's palsy - Plan: Ambulatory referral to Physical Therapy  Synkinesia - Plan: Ambulatory referral to Physical Therapy  History of embolic stroke  PFO with atrial septal aneurysm   1.   Advised to continue to take the aspirin daily.  We discussed that the stroke is chronic and has nothing to do with his left facial weakness.      Consider MRI in the future to see if more strokes -- if so, would refer to Cardiology for reconsideration of PFO closure 2.    His Bell's palsy appears to have improved some but lots of synkinesis.   I'll refer to PT and write lacrilube   3.    He will return to see me in 12 months or sooner if there are new or worsening symptoms.  Thank you for asking me to see Mr. Crotty for neurologic consultation. Please let me know if I can be of further assistance with her or other patients in the future. Symon Norwood A. Epimenio Foot, MD, PhD 10/07/2016, 10:17 AM Certified in Neurology, Clinical Neurophysiology, Sleep Medicine, Pain Medicine and Neuroimaging  [facility] [address], [address] [place], [place] [address] [phone]

## 2016-10-27 ENCOUNTER — Ambulatory Visit: Payer: BLUE CROSS/BLUE SHIELD

## 2017-04-15 ENCOUNTER — Encounter (HOSPITAL_COMMUNITY): Payer: Self-pay | Admitting: *Deleted

## 2017-04-15 ENCOUNTER — Emergency Department (HOSPITAL_COMMUNITY)
Admission: EM | Admit: 2017-04-15 | Discharge: 2017-04-15 | Disposition: A | Discharge status: Home / Self Care | Payer: BLUE CROSS/BLUE SHIELD | Attending: Emergency Medicine | Admitting: Emergency Medicine

## 2017-04-15 DIAGNOSIS — J069 Acute upper respiratory infection, unspecified: Secondary | ICD-10-CM | POA: Insufficient documentation

## 2017-04-15 DIAGNOSIS — B9789 Other viral agents as the cause of diseases classified elsewhere: Secondary | ICD-10-CM | POA: Insufficient documentation

## 2017-04-15 DIAGNOSIS — Z7982 Long term (current) use of aspirin: Secondary | ICD-10-CM | POA: Insufficient documentation

## 2017-04-15 DIAGNOSIS — Z79899 Other long term (current) drug therapy: Secondary | ICD-10-CM | POA: Insufficient documentation

## 2017-04-15 DIAGNOSIS — G510 Bell's palsy: Secondary | ICD-10-CM | POA: Insufficient documentation

## 2017-04-15 MED ORDER — BENZONATATE 100 MG PO CAPS: 100.0000 mg | ORAL_CAPSULE | Freq: Three times a day (TID) | ORAL | 0 refills | Status: AC | PRN

## 2017-04-15 NOTE — ED Provider Notes (Signed)
[facility] Provider Note   CSN: [id number] Arrival date & time: 04/15/17  1223     History   Chief Complaint Chief Complaint  Patient presents with  . URI    HPI Jimmy Ford is a 61 y.o. male who presents to the emergency department with a chief complaint of cough. He reports that the cough began 7 days ago with productive yellow sputum, which has improved after taking DayQuil and NyQuil. He denies fever and chills. No shortness of breath. He denies sore throat, otalgia, rhinorrhea, or nasal congestion at this time. No history of epigastric pain or asthma.  He is a nonsmoker.  The history is provided by the patient. No language interpreter was used.    Past Medical History:  Diagnosis Date  . Vision abnormalities     Patient Active Problem List   Diagnosis Date Noted  . Synkinesia 10/07/2016  . History of embolic stroke 03/01/2016  . 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 Left-sided Bell's palsy 12/30/2015    Past Surgical History:  Procedure Laterality Date  . CYST REMOVAL HAND         Home Medications    Prior to Admission medications   Medication Sig Start Date End Date Taking? Authorizing Provider  artificial tears (LACRILUBE) OINT ophthalmic ointment Place into the left eye 2 (two) times daily. 10/07/16   Sater, Pearletha Furl, MD  aspirin 81 MG tablet Take 81 mg by mouth daily.    [provider]  benzonatate (TESSALON) 100 MG capsule Take 1 capsule (100 mg total) by mouth every 8 (eight) hours as needed for cough. 04/15/17   Mykeisha Dysert, Coral Else, PA-C    Family History Family History  Problem Relation Age of Onset  . Glaucoma Mother   . Diabetes Father   . Diabetes Sister   . Diabetes Maternal Grandmother     Social History Social History  Substance Use Topics  . Smoking status: Never Smoker  . Smokeless tobacco: Never Used  . Alcohol use No     Allergies   Patient has no known allergies.   Review of Systems Review  of Systems  Constitutional: Negative for activity change, chills and fever.  HENT: Negative for congestion, ear pain, sneezing and sore throat.   Respiratory: Positive for cough. Negative for shortness of breath.   Cardiovascular: Negative for chest pain.  Gastrointestinal: Negative for abdominal pain.  Musculoskeletal: Negative for back pain.  Skin: Negative for rash.     Physical Exam Updated Vital Signs BP (!) 147/94 (BP Location: Right Arm)   Pulse 61   Temp 98.2 F (36.8 C) (Oral)   Resp 18   SpO2 100%   Physical Exam  Constitutional: He appears well-developed.  HENT:  Head: Normocephalic.  Right Ear: Tympanic membrane normal.  Left Ear: Tympanic membrane normal.  Nose: Nose normal. Right sinus exhibits no maxillary sinus tenderness and no frontal sinus tenderness. Left sinus exhibits no maxillary sinus tenderness and no frontal sinus tenderness.  Mouth/Throat: Uvula is midline and oropharynx is clear and moist.  Minimal  posterior oropharynx erythema noted.   Eyes: Conjunctivae are normal.  Neck: Neck supple.  Cardiovascular: Normal rate, regular rhythm and normal heart sounds.  Exam reveals no gallop and no friction rub.   No murmur heard. Pulmonary/Chest: Effort normal. No respiratory distress. He has no wheezes. He has no rales.  Lungs are clear to auscultation bilaterally.  Abdominal: Soft. He exhibits no distension.  Lymphadenopathy:  He has no cervical adenopathy.  Neurological: He is alert.  Skin: Skin is warm and dry.  Psychiatric: His behavior is normal.  Nursing note and vitals reviewed.    ED Treatments / Results  Labs (all labs ordered are listed, but only abnormal results are displayed) Labs Reviewed - No data to display  EKG  EKG Interpretation None       Radiology No results found.  Procedures Procedures (including critical care time)  Medications Ordered in ED Medications - No data to display   Initial Impression / Assessment  and Plan / ED Course  I have reviewed the triage vital signs and the nursing notes.  Pertinent labs & imaging results that were available during my care of the patient were reviewed by me and considered in my medical decision making (see chart for details).     Pt CXR negative for acute infiltrate. Patients symptoms are consistent with URI, likely viral etiology. Discussed that antibiotics are not indicated for viral infections. Pt will be discharged with symptomatic treatment.  Verbalizes understanding and is agreeable with plan. Pt is hemodynamically stable & in NAD prior to dc.  Final Clinical Impressions(s) / ED Diagnoses   Final diagnoses:  Viral URI with cough    New Prescriptions New Prescriptions   BENZONATATE (TESSALON) 100 MG CAPSULE    Take 1 capsule (100 mg total) by mouth every 8 (eight) hours as needed for cough.     Frederik Pear A, PA-C 04/15/17 1536    Rolan Bucco, MD 04/15/17 [phone]

## 2017-04-15 NOTE — ED Triage Notes (Signed)
Pt reports cough and cold symptoms for over 4 days. Cough is productive with yellow sputum and reports possible fever. No relief with otc meds at home. No distress is noted at triage.

## 2017-04-15 NOTE — Discharge Instructions (Signed)
One tablet of Tessalon may be taken every 8 hours as needed for cough.  The cough may be the last symptom to improve with a viral infection. Drinking warm or cold liquids to help soothe the back of your throat may also reduce your cough. Follow-up with your primary care provider if your cough does not improve in the next couple of weeks.  If you develop worsening symptoms including fever, a cough with worsening sputum, or shortness of breath, please return to the emergency department for reevaluation.

## 2017-10-07 ENCOUNTER — Other Ambulatory Visit: Payer: Self-pay

## 2017-10-07 ENCOUNTER — Ambulatory Visit (INDEPENDENT_AMBULATORY_CARE_PROVIDER_SITE_OTHER): Payer: BLUE CROSS/BLUE SHIELD | Admitting: Neurology

## 2017-10-07 ENCOUNTER — Encounter: Payer: Self-pay | Admitting: Neurology

## 2017-10-07 VITALS — BP 142/97 | HR 72 | Resp 16 | Wt 178.5 lb

## 2017-10-07 DIAGNOSIS — R258 Other abnormal involuntary movements: Secondary | ICD-10-CM

## 2017-10-07 DIAGNOSIS — Q211 Atrial septal defect: Secondary | ICD-10-CM | POA: Diagnosis not present

## 2017-10-07 DIAGNOSIS — G510 Bell's palsy: Secondary | ICD-10-CM | POA: Diagnosis not present

## 2017-10-07 DIAGNOSIS — R590 Localized enlarged lymph nodes: Secondary | ICD-10-CM | POA: Diagnosis not present

## 2017-10-07 DIAGNOSIS — Z8673 Personal history of transient ischemic attack (TIA), and cerebral infarction without residual deficits: Secondary | ICD-10-CM | POA: Diagnosis not present

## 2017-10-07 DIAGNOSIS — I253 Aneurysm of heart: Secondary | ICD-10-CM

## 2017-10-07 NOTE — Patient Instructions (Signed)
For the itchy dry eye on the left, use Lacri-Lube at night.  Continue to take aspirin  If lymph nodes do not improve over the next week or two, call your primary care doctor. If unable to get in and give us a call.

## 2017-10-07 NOTE — Progress Notes (Signed)
[facility]  PATIENT: Jimmy Ford DOB: May 11, 1956  REFERRING DOCTOR OR PCP:  Renaye Rakers SOURCE: patient, records from Dr. Parke Simmers  _________________________________   HISTORICAL  CHIEF COMPLAINT:  Chief Complaint  Patient presents with  . Bell's Palsy    Still has some left facial palsy. Sts. left eye has been itching.  Denies new CVA sx. and reports compliance with ASA. (PFO was too small to close)/fim  . Hx of CVA  . PFO    HISTORY OF PRESENT ILLNESS:  Jimmy Ford is a 62 year old man with Bell's palsy and a chronic embolic stroke on MRI who has a PFO on ECHO.      Update 10/07/2017: Jimmy Ford reports that he still has some left facial weakness.   Although strength improved, he does have crossed reinnervation and when he smiles the ptosis worsens. When he blinks his eyes the corner of the left side of his mouth raises. He also notes some tears on the left when he eats.    At times the left eye itches.     He uses artificial tears mostly at night.    He had used lacrilube in the past  He had an embolic stroke in the past and is on aspirin. A PFO was discovered but it was felt to be too small to close. As not noted any new TIA or CVA symptoms.    The carotid ultrasound looks good.      He notes enlarged lymph nodes in his neck the past month.   He has not noted any recent infections.    He does not feel more tired than usual.       From 10/07/2016: Bell's Palsy:   Since the last visit, he feels the strength is better in the right face and he no longer feels numb.   Chewing is a little better.   He had the onset of left facial weakness one afternoon in late February 2017. At the onset, there was pain behind the left ear.   Later that evening, he went to the emergency room and was given steroids. He did not note any improvement over the next couple of months. I saw him about 4 months after the onset.   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    Currently he has more muscle tone in the lower face than upper face.   He can close eye better and have a partial smile.  He can't puff out left cheek.   He still has left ptosis and eye sometimes feels itchy.   He notes tears while eating on the left and has noticeable synkinesia with blinking and smiling.    CVA/PFO:   MRI last year showed a chronic stroke.  He has never reported actual symptoms from a stroke. However, there is a small wedge-shaped region of encephalomalacia in the left parieto-occipital region consistent with a small remote infarction, possibly embolic.     Echocardiogram showed an aneurysmal atrial septum and bubble contrast went from the right to the left consistent with a PFO.  He saw cardiology and PFO closure was not recommended.   MRI showed of the brain with the small parietal chronic wedge-shaped stroke and a mild enhancement noted in part of the left facial nerve.   We discussed the findings of the carotid ultrasound and more importantly the findings of the echocardiogram.  He is otherwise healthy. He denies numbness or weakness elsewhere. He notes  no change with walking or balance.  No diplopia. No headache.   REVIEW OF SYSTEMS: Constitutional: No fevers, chills, sweats, or change in appetite Eyes: No visual changes, double vision, eye pain Ear, nose and throat: No hearing loss, ear pain, nasal congestion, sore throat Cardiovascular: No chest pain, palpitations Respiratory: No shortness of breath at rest or with exertion.   No wheezes GastrointestinaI: No nausea, vomiting, diarrhea, abdominal pain, fecal incontinence Genitourinary: No dysuria, urinary retention or frequency.  No nocturia. Musculoskeletal: No neck pain, back pain Integumentary: No rash, pruritus, skin lesions Neurological: as above Psychiatric: No depression at this time.  No anxiety Endocrine: No palpitations, diaphoresis, change in appetite, change in weigh or increased  thirst Hematologic/Lymphatic: No anemia, purpura, petechiae. Allergic/Immunologic: No itchy/runny eyes, nasal congestion, recent allergic reactions, rashes  ALLERGIES: No Known Allergies  HOME MEDICATIONS:  Current Outpatient Medications:  .  artificial tears (LACRILUBE) OINT ophthalmic ointment, Place into the left eye 2 (two) times daily., Disp: 2 Tube, Rfl: 5 .  aspirin 81 MG tablet, Take 81 mg by mouth daily., Disp: , Rfl:  .  benzonatate (TESSALON) 100 MG capsule, Take 1 capsule (100 mg total) by mouth every 8 (eight) hours as needed for cough. (Patient not taking: Reported on 10/07/2017), Disp: 21 capsule, Rfl: 0 No current facility-administered medications for this visit.   Facility-Administered Medications Ordered in Other Visits:  .  gadopentetate dimeglumine (MAGNEVIST) injection 15 mL, 15 mL, Intravenous, Once PRN, Reinaldo Helt, Pearletha Furl, MD  PAST MEDICAL HISTORY: Past Medical History:  Diagnosis Date  . Vision abnormalities     PAST SURGICAL HISTORY: Past Surgical History:  Procedure Laterality Date  . CYST REMOVAL HAND      FAMILY HISTORY: Family History  Problem Relation Age of Onset  . Glaucoma Mother   . Diabetes Father   . Diabetes Sister   . Diabetes Maternal Grandmother     SOCIAL HISTORY:  Social History   Socioeconomic History  . Marital status: Married    Spouse name: Not on file  . Number of children: Not on file  . Years of education: Not on file  . Highest education level: Not on file  Social Needs  . Financial resource strain: Not on file  . Food insecurity - worry: Not on file  . Food insecurity - inability: Not on file  . Transportation needs - medical: Not on file  . Transportation needs - non-medical: Not on file  Occupational History  . Not on file  Tobacco Use  . Smoking status: Never Smoker  . Smokeless tobacco: Never Used  Substance and Sexual Activity  . Alcohol use: No    Alcohol/week: 0.0 oz  . Drug use: No  . Sexual  activity: Not on file  Other Topics Concern  . Not on file  Social History Narrative  . Not on file     PHYSICAL EXAM  Vitals:   10/07/17 0953  BP: (!) 142/97  Pulse: 72  Resp: 16  Weight: 178 lb 8 oz (81 kg)    Body mass index is 27.96 kg/m.   General: The patient is well-developed and well-nourished and in no acute distress  Skin:   No rashes.    Neurologic Exam  Mental status: The patient is alert and oriented x 3 at the time of the examination. The patient has apparent normal recent and remote memory, with an apparently normal attention span and concentration ability.   Speech is normal.  Cranial nerves: Extraocular movements are  full. Pupils are equal, round, and reactive to light and accomodation.  Visual fields are full.  Facial strength is much better on the left than the initial evaluation and the nasolabial fold is fairly symmetric. There is still mild ptosis on the left. There is considerable syncope she and smiling causes further ptosis and blinking causes the corner of his mouth to rise.    Trapezius and sternocleidomastoid strength is normal. No dysarthria is noted.  The tongue is midline, and the patient has symmetric elevation of the soft palate. No obvious hearing deficits are noted.  Motor:  Muscle bulk is normal.   Tone is normal. Strength is  5 / 5 in all 4 extremities.   Sensory: Sensory testing is intact to soft touch and vibration sensation in all 4 extremities.  Gait and station: Station is normal.  The gait and tandem gait are normal..    Reflexes: Deep tendon reflexes are symmetric and normal bilaterally.       DIAGNOSTIC DATA (LABS, IMAGING, TESTING) - I reviewed patient records, labs, notes, testing and imaging myself where available.  Lab Results  Component Value Date   HGB 15.7 04/14/2010   No results found for: NA, K, CL, CO2, GLUCOSE, BUN, CREATININE, CALCIUM, PROT, ALBUMIN, AST, ALT, ALKPHOS, BILITOT, GFRNONAA,  GFRAA     ASSESSMENT AND PLAN   Left-sided Bell's palsy  History of embolic stroke  Synkinesia  PFO with atrial septal aneurysm  Enlarged lymph node in neck   1.   Continue aspirin daily. 2.    Lacri-Lube for the left eye.  The itchiness could be from dryness at night  3.    If the lymph node enlarges further or does not go away he should see his primary care provider to determine if further evaluation is necessary.  4. rtc prn if there are new or worsening symptoms.  Thank you for asking me to see Jimmy Ford for neurologic consultation. Please let me know if I can be of further assistance with her or other patients in the future. Ivonna Kinnick A. Epimenio FootSater, MD, PhD 10/07/2017, 11:02 AM Certified in Neurology, Clinical Neurophysiology, Sleep Medicine, Pain Medicine and Neuroimaging  [facility] [address], [address] [place], [place] [address] [phone]

## 2019-02-06 ENCOUNTER — Other Ambulatory Visit: Payer: Self-pay | Admitting: *Deleted

## 2019-02-06 DIAGNOSIS — Z20822 Contact with and (suspected) exposure to covid-19: Secondary | ICD-10-CM

## 2019-02-12 LAB — NOVEL CORONAVIRUS, NAA: SARS-CoV-2, NAA: NOT DETECTED

## 2019-05-22 ENCOUNTER — Other Ambulatory Visit: Payer: Self-pay

## 2019-05-22 ENCOUNTER — Ambulatory Visit: Payer: BC Managed Care – PPO

## 2019-05-22 DIAGNOSIS — Z23 Encounter for immunization: Secondary | ICD-10-CM

## 2023-09-17 ENCOUNTER — Encounter (HOSPITAL_COMMUNITY): Payer: Self-pay

## 2023-09-17 ENCOUNTER — Ambulatory Visit (HOSPITAL_COMMUNITY)
Admission: EM | Admit: 2023-09-17 | Discharge: 2023-09-17 | Disposition: A | Discharge status: Home / Self Care | Payer: BC Managed Care – PPO | Attending: Internal Medicine | Admitting: Internal Medicine

## 2023-09-17 DIAGNOSIS — J101 Influenza due to other identified influenza virus with other respiratory manifestations: Secondary | ICD-10-CM

## 2023-09-17 LAB — POC COVID19/FLU A&B COMBO
Covid Antigen, POC: NEGATIVE
Influenza A Antigen, POC: POSITIVE — AB
Influenza B Antigen, POC: NEGATIVE

## 2023-09-17 MED ORDER — PROMETHAZINE-DM 6.25-15 MG/5ML PO SYRP: 5.0000 mL | ORAL_SOLUTION | Freq: Three times a day (TID) | ORAL | 0 refills | Status: AC | PRN

## 2023-09-17 MED ORDER — PREDNISONE 20 MG PO TABS: 40.0000 mg | ORAL_TABLET | Freq: Every day | ORAL | 0 refills | Status: AC

## 2023-09-17 NOTE — ED Triage Notes (Signed)
 Chief Complaint: Fever, chills, chest congestion, cough that is productive, body aches. Negative home COVID test this morning.   Sick exposure: No  Onset: This past Thursday   Prescriptions or OTC medications tried: Yes- Nyquil, Mucinex    with little relief  New foods, medications, or products: No  Recent Travel: No

## 2023-09-17 NOTE — ED Provider Notes (Signed)
 [facility]    CSN: [id number] Arrival date & time: 09/17/23  1246      History   Chief Complaint Chief Complaint  Patient presents with   Cough    HPI Jimmy Ford is a 68 y.o. male.   68 year old male who presents urgent care with complaints of cough, congestion, chest congestion, generalized bodyaches and poor appetite.  He has also been having fevers and chills.  This started around Thursday.  He has not been able to eat much of anything since then.  The cough is productive with yellow mucus.  He took a at home COVID test this morning which was negative.  He is unsure if he has had any sick exposures but does work in [employer] department where he is around lots of people and in a cold environment on a regular basis.  He denies chest pain, shortness of breath, ear pain, sore throat.   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 Authorizing Provider  aspirin 81 MG tablet Take 81 mg by mouth daily.   Yes [provider]  artificial tears (LACRILUBE) OINT ophthalmic ointment Place into the left eye 2 (two) times daily. 10/07/16   Sater, Pearletha Furl, MD  benzonatate (TESSALON) 100 MG capsule Take 1 capsule (100 mg total) by mouth every 8 (eight) hours as needed for cough. Patient not taking: Reported on 10/07/2017 04/15/17   Barkley Boards, PA-C    Family  History Family History  Problem Relation Age of Onset   Glaucoma Mother    Diabetes Father    Diabetes Sister    Diabetes Maternal Grandmother     Social History Social History   Tobacco Use   Smoking status: Never   Smokeless tobacco: Never  Vaping Use   Vaping status: Never Used  Substance Use Topics   Alcohol use: No    Alcohol/week: 0.0 standard drinks of alcohol   Drug use: No     Allergies   Patient has no known allergies.   Review of Systems Review of Systems  Constitutional:  Positive for chills, fatigue and fever.  HENT:  Positive for congestion. Negative for ear pain and sore throat.   Eyes:  Negative for pain and visual disturbance.  Respiratory:  Positive for cough. Negative for shortness of breath.   Cardiovascular:  Negative for chest pain and palpitations.  Gastrointestinal:  Negative for abdominal pain and vomiting.  Genitourinary:  Negative for dysuria and hematuria.  Musculoskeletal:  Negative for arthralgias and back pain.  Skin:  Negative for color change and rash.  Neurological:  Negative for seizures and syncope.  All other systems reviewed and are negative.    Physical Exam Triage Vital Signs ED Triage Vitals  Encounter Vitals Group     BP 09/17/23 1516  112/78     Systolic BP Percentile --      Diastolic BP Percentile --      Pulse Rate 09/17/23 1516 76     Resp 09/17/23 1516 18     Temp 09/17/23 1516 (!) 100.9 F (38.3 C)     Temp Source 09/17/23 1516 Oral     SpO2 09/17/23 1516 94 %     Weight 09/17/23 1516 155 lb (70.3 kg)     Height 09/17/23 1516 5\' 7"  (1.702 m)     Head Circumference --      Peak Flow --      Pain Score 09/17/23 1515 8     Pain Loc --      Pain Education --      Exclude from Growth Chart --    No data found.  Updated Vital Signs BP 112/78 (BP Location: Right Arm)   Pulse 76   Temp (!) 100.9 F (38.3 C) (Oral) Comment: Mucinex last taken yesterday night.  Resp 18   Ht 5\' 7"  (1.702 m)   Wt 155 lb  (70.3 kg)   SpO2 94%   BMI 24.28 kg/m   Visual Acuity Right Eye Distance:   Left Eye Distance:   Bilateral Distance:    Right Eye Near:   Left Eye Near:    Bilateral Near:     Physical Exam Vitals and nursing note reviewed.  Constitutional:      General: He is not in acute distress.    Appearance: He is well-developed.  HENT:     Head: Normocephalic and atraumatic.     Right Ear: Tympanic membrane normal.     Left Ear: Tympanic membrane normal.     Nose: Nose normal.     Mouth/Throat:     Mouth: Mucous membranes are moist.  Eyes:     Conjunctiva/sclera: Conjunctivae normal.  Cardiovascular:     Rate and Rhythm: Normal rate and regular rhythm.     Heart sounds: No murmur heard. Pulmonary:     Effort: Pulmonary effort is normal. No respiratory distress.     Breath sounds: Normal breath sounds.  Abdominal:     Palpations: Abdomen is soft.     Tenderness: There is no abdominal tenderness.  Musculoskeletal:        General: No swelling.     Cervical back: Neck supple.  Skin:    General: Skin is warm and dry.     Capillary Refill: Capillary refill takes less than 2 seconds.  Neurological:     Mental Status: He is alert.  Psychiatric:        Mood and Affect: Mood normal.      UC Treatments / Results  Labs (all labs ordered are listed, but only abnormal results are displayed) Labs Reviewed - No data to display  EKG   Radiology No results found.  Procedures Procedures (including critical care time)  Medications Ordered in UC Medications - No data to display  Initial Impression / Assessment and Plan / UC Course  I have reviewed the triage vital signs and the nursing notes.  Pertinent labs & imaging results that were available during my care of the patient were reviewed by me and considered in my medical decision making (see chart for details).    Influenza A   Flu A, flu B and COVID testing done today.  Flu a is positive.  This is a virus and does not  require antibiotic treatment.  We treat the  symptoms.  We can treat with the following: Promethazine DM 5 mL every 8 hours as needed for cough.  Use caution as this medication can cause drowsiness. Prednisone 40 mg (2 tablets) once daily for 5 days. Take this in the morning.  This is a steroid to help with inflammation and pain. Rest and stay hydrated.  Return to urgent care or PCP if symptoms worsen or fail to resolve.    Final Clinical Impressions(s) / UC Diagnoses   Final diagnoses:  None   Discharge Instructions   None    ED Prescriptions   None    PDMP not reviewed this encounter.   Landis Martins, [place] 09/17/23 1558

## 2023-09-17 NOTE — Discharge Instructions (Addendum)
 Flu A, flu B and COVID testing done today.  Flu a is positive.  This is a virus and does not require antibiotic treatment.  We treat the symptoms.  We can treat with the following: Promethazine DM 5 mL every 8 hours as needed for cough.  Use caution as this medication can cause drowsiness. Prednisone 40 mg (2 tablets) once daily for 5 days. Take this in the morning.  This is a steroid to help with inflammation and pain. Rest and stay hydrated.  Return to urgent care or PCP if symptoms worsen or fail to resolve.
